# Patient Record
Sex: Male | Born: 1990
Health system: Southern US, Community
[De-identification: ages and names within clinical notes are randomized; demographics above are authoritative.]

## PROBLEM LIST (undated history)

## (undated) HISTORY — PX: WRIST SURGERY: SHX841

---
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
## 2016-09-21 ENCOUNTER — Encounter (HOSPITAL_BASED_OUTPATIENT_CLINIC_OR_DEPARTMENT_OTHER): Payer: Self-pay | Admitting: *Deleted

## 2016-09-21 ENCOUNTER — Emergency Department (HOSPITAL_BASED_OUTPATIENT_CLINIC_OR_DEPARTMENT_OTHER)
Admission: EM | Admit: 2016-09-21 | Discharge: 2016-09-21 | Disposition: A | Discharge status: Home / Self Care | Payer: 59 | Attending: Emergency Medicine | Admitting: Emergency Medicine

## 2016-09-21 DIAGNOSIS — Z23 Encounter for immunization: Secondary | ICD-10-CM | POA: Diagnosis not present

## 2016-09-21 DIAGNOSIS — W540XXA Bitten by dog, initial encounter: Secondary | ICD-10-CM | POA: Insufficient documentation

## 2016-09-21 DIAGNOSIS — Y929 Unspecified place or not applicable: Secondary | ICD-10-CM | POA: Diagnosis not present

## 2016-09-21 DIAGNOSIS — Y939 Activity, unspecified: Secondary | ICD-10-CM | POA: Insufficient documentation

## 2016-09-21 DIAGNOSIS — Y999 Unspecified external cause status: Secondary | ICD-10-CM | POA: Diagnosis not present

## 2016-09-21 DIAGNOSIS — S61432A Puncture wound without foreign body of left hand, initial encounter: Secondary | ICD-10-CM | POA: Insufficient documentation

## 2016-09-21 DIAGNOSIS — S6992XA Unspecified injury of left wrist, hand and finger(s), initial encounter: Secondary | ICD-10-CM | POA: Diagnosis present

## 2016-09-21 MED ORDER — TETANUS-DIPHTH-ACELL PERTUSSIS 5-2.5-18.5 LF-MCG/0.5 IM SUSP
0.5000 mL | Freq: Once | INTRAMUSCULAR | Status: AC
  Administered 2016-09-21: 0.5 mL via INTRAMUSCULAR
  Filled 2016-09-21: qty 0.5

## 2016-09-21 MED ORDER — AMOXICILLIN-POT CLAVULANATE 875-125 MG PO TABS: 1.0000 | ORAL_TABLET | Freq: Two times a day (BID) | ORAL | 0 refills | Status: DC

## 2016-09-21 MED FILL — AMOX-CLAV 875-125 MG TABLET: 875-125 | 7 days supply | Qty: 14 | Fill #0

## 2016-09-21 NOTE — ED Provider Notes (Signed)
[facility] Provider [facility]   CSN: [id number] Arrival date & time: 09/21/16  1420     History   Chief Complaint Chief Complaint  Patient presents with  . Animal Bite    HPI Brian Jacobson is a 26 y.o. male.  The history is provided by the patient and medical records. No language interpreter was used.  Animal Bite  Associated symptoms: no numbness     History reviewed. No pertinent past medical history.  There are no active problems to display for this patient.   History reviewed. No pertinent surgical history.   Brian Jacobson is an otherwise healthy 26 y.o. male who presents to the Emergency Department complaining of dog bite just prior to arrival. Small puncture wounds to left hand. No meds or treatments prior to arrival for symptoms. Unsure of the dog, but states dog was wearing a collar and looked like a pet. He does not believe he can locate the dog and has not contacted Research scientist (life sciences). No numbness, tingling. Able to move the hand without difficulty. Unsure of tetanus status.    Home Medications    Prior to Admission medications   Medication Sig Start Date End Date Taking? Authorizing Provider  amoxicillin-clavulanate (AUGMENTIN) 875-125 MG tablet Take 1 tablet by mouth every 12 (twelve) hours. 09/21/16   Lakayla Barrington, Chase Picket, PA-C    Family History History reviewed. No pertinent family history.  Social History Social History  Substance Use Topics  . Smoking status: Never Smoker  . Smokeless tobacco: Not on file  . Alcohol use No     Allergies   Patient has no known allergies.   Review of Systems Review of Systems  Skin: Positive for wound.  Neurological: Negative for weakness and numbness.     Physical Exam Updated Vital Signs BP (!) 143/96 (BP Location: Right Arm)   Pulse 65   Temp 98.4 F (36.9 C) (Oral)   Resp 18   Ht  (1.778 m)   Wt 70.3 kg (155 lb)   SpO2 99%   BMI 22.24 kg/m   Physical Exam  Constitutional: He appears  well-developed and well-nourished. No distress.  HENT:  Head: Normocephalic and atraumatic.  Neck: Neck supple.  Cardiovascular: Normal rate, regular rhythm and normal heart sounds.   No murmur heard. Pulmonary/Chest: Effort normal and breath sounds normal. No respiratory distress. He has no wheezes. He has no rales.  Musculoskeletal: Normal range of motion.  Left hand with full ROM and 5/5 strength. Sensation intact. 2+ radial pulse. Good cap refill of all digits.   Neurological: He is alert.  Skin: Skin is warm and dry.  Three very small puncture wound to left hand, all < 5mm.   Nursing note and vitals reviewed.    ED Treatments / Results  Labs (all labs ordered are listed, but only abnormal results are displayed) Labs Reviewed - No data to display  EKG  EKG Interpretation None       Radiology No results found.  Procedures Procedures (including critical care time)  Medications Ordered in ED Medications  Tdap (BOOSTRIX) injection 0.5 mL (0.5 mLs Intramuscular Given 09/21/16 1517)     Initial Impression / Assessment and Plan / ED Course  I have reviewed the triage vital signs and the nursing notes.  Pertinent labs & imaging results that were available during my care of the patient were reviewed by me and considered in my medical decision making (see chart for details).    Brian Jacobson is  a 26 y.o. male who presents to ED for evaluation of dog bite which occurred just prior to arrival. Very small puncture-like wounds which were thoroughly cleaned in ED. No wounds requiring repair. Tetanus updated. Discussed home wound care instructions. Patient was bitten by an unknown dog. He does not believe that he could find the animal and animal control was not called. I spoke with the patient at length about need for rabies vaccination however patient denies. Patient's nurse also spoke with him twice about receiving rabies vaccine. I specifically discussed that if patient did  contract rabies from the dog bite, this would be a life-threatening condition for which there is no cure. Patient still refused vaccine. Will give ABX for infection prevention. Reasons to return to ER discussed. All questions answered.  Final Clinical Impressions(s) / ED Diagnoses   Final diagnoses:  Dog bite, initial encounter    New Prescriptions New Prescriptions   AMOXICILLIN-CLAVULANATE (AUGMENTIN) 875-125 MG TABLET    Take 1 tablet by mouth every 12 (twelve) hours.     Rosali Augello, Chase Picket, PA-C 09/21/16 1542    Benjiman Core, MD 09/24/16 [phone]

## 2016-09-21 NOTE — Discharge Instructions (Signed)
It was my pleasure taking care of you today!   Please take all of your antibiotics until finished! This is to prevent infection.   Keep wounds clean and dry.   Return to ER for new or worsening symptoms, any additional concerns.

## 2016-09-21 NOTE — ED Notes (Signed)
ED Provider at bedside. 

## 2016-09-21 NOTE — ED Triage Notes (Signed)
Dog bite to left hand-middle finger and index finger. Unknown dog or Research scientist (life sciences). Bleeding controlled.

## 2017-02-15 ENCOUNTER — Other Ambulatory Visit: Payer: Self-pay

## 2017-02-15 ENCOUNTER — Encounter (HOSPITAL_BASED_OUTPATIENT_CLINIC_OR_DEPARTMENT_OTHER): Payer: Self-pay | Admitting: Emergency Medicine

## 2017-02-15 ENCOUNTER — Emergency Department (HOSPITAL_BASED_OUTPATIENT_CLINIC_OR_DEPARTMENT_OTHER)
Admission: EM | Admit: 2017-02-15 | Discharge: 2017-02-15 | Disposition: A | Discharge status: Home / Self Care | Payer: 59 | Attending: Emergency Medicine | Admitting: Emergency Medicine

## 2017-02-15 DIAGNOSIS — J111 Influenza due to unidentified influenza virus with other respiratory manifestations: Secondary | ICD-10-CM | POA: Insufficient documentation

## 2017-02-15 DIAGNOSIS — R69 Illness, unspecified: Secondary | ICD-10-CM | POA: Diagnosis present

## 2017-02-15 DIAGNOSIS — Z79899 Other long term (current) drug therapy: Secondary | ICD-10-CM | POA: Diagnosis not present

## 2017-02-15 MED ORDER — OSELTAMIVIR PHOSPHATE 75 MG PO CAPS: 75.0000 mg | ORAL_CAPSULE | Freq: Two times a day (BID) | ORAL | 0 refills | Status: DC

## 2017-02-15 NOTE — Discharge Instructions (Signed)
Tamiflu as prescribed.  Tylenol 1000 mg rotated with ibuprofen 600 mg every 4 hours as needed for pain or fever.  Drink plenty of fluids and get plenty of rest.  Return to the emergency department if you develop difficulty breathing, severe chest pain, severe weakness, or other new and concerning symptoms.

## 2017-02-15 NOTE — ED Provider Notes (Signed)
[facility] EMERGENCY DEPARTMENT Provider Note   CSN: [id number] Arrival date & time: 02/15/17  0133     History   Chief Complaint Chief Complaint  Patient presents with  . Influenza    HPI Brian Jacobson is a 27 y.o. male.  Patient is a 27 year old male with no significant past medical history.  He presents with a 2-day history of muscle aches, fever, cough, and headache.  This came on abruptly while at work.  He was seen at urgent care yesterday and was prescribed Zithromax and a muscle relaxer.  He returns tonight stating that he is not feeling any better.   The history is provided by the patient.  Influenza  Presenting symptoms: cough, fever, headache and myalgias   Severity:  Moderate Onset quality:  Sudden Duration:  2 days Progression:  Worsening Chronicity:  New Relieved by:  Nothing Worsened by:  Nothing   History reviewed. No pertinent past medical history.  There are no active problems to display for this patient.   History reviewed. No pertinent surgical history.     Home Medications    Prior to Admission medications   Medication Sig Start Date End Date Taking? Authorizing Provider  amoxicillin-clavulanate (AUGMENTIN) 875-125 MG tablet Take 1 tablet by mouth every 12 (twelve) hours. 09/21/16   Ward, Chase Picket, PA-C    Family History No family history on file.  Social History Social History   Tobacco Use  . Smoking status: Never Smoker  Substance Use Topics  . Alcohol use: No  . Drug use: No     Allergies   Patient has no known allergies.   Review of Systems Review of Systems  Constitutional: Positive for fever.  Respiratory: Positive for cough.   Musculoskeletal: Positive for myalgias.  Neurological: Positive for headaches.  All other systems reviewed and are negative.    Physical Exam Updated Vital Signs BP 107/72 (BP Location: Left Arm)   Pulse 100   Temp 100.2 F (37.9 C) (Oral)   Resp 20   SpO2 94%    Physical Exam  Constitutional: He is oriented to person, place, and time. He appears well-developed and well-nourished. No distress.  HENT:  Head: Normocephalic and atraumatic.  Mouth/Throat: Oropharynx is clear and moist.  Neck: Normal range of motion. Neck supple.  Cardiovascular: Normal rate and regular rhythm. Exam reveals no friction rub.  No murmur heard. Pulmonary/Chest: Effort normal and breath sounds normal. No respiratory distress. He has no wheezes. He has no rales.  Abdominal: Soft. Bowel sounds are normal. He exhibits no distension. There is no tenderness.  Musculoskeletal: Normal range of motion. He exhibits no edema.  Neurological: He is alert and oriented to person, place, and time. Coordination normal.  Skin: Skin is warm and dry. He is not diaphoretic.  Nursing note and vitals reviewed.    ED Treatments / Results  Labs (all labs ordered are listed, but only abnormal results are displayed) Labs Reviewed - No data to display  EKG  EKG Interpretation None       Radiology No results found.  Procedures Procedures (including critical care time)  Medications Ordered in ED Medications - No data to display   Initial Impression / Assessment and Plan / ED Course  I have reviewed the triage vital signs and the nursing notes.  Pertinent labs & imaging results that were available during my care of the patient were reviewed by me and considered in my medical decision making (see chart for details).  Vision symptoms are most likely viral, likely influenza.  He will be prescribed Tamiflu and advised to continue Tylenol/Motrin, plenty of fluids, and follow-up as needed.  Final Clinical Impressions(s) / ED Diagnoses   Final diagnoses:  None    ED Discharge Orders    None       Geoffery Lyonselo, Ashtian Villacis, MD 02/15/17 [phone]

## 2017-02-15 NOTE — ED Triage Notes (Signed)
Pt presents with c/o flu like symptoms since Sunday. Pt went to PCP today and was given a zpack but tonight started vomiting and states fever was 105.4. Pt states he took dayquil at that time

## 2017-05-23 ENCOUNTER — Other Ambulatory Visit: Payer: Self-pay

## 2017-05-23 ENCOUNTER — Encounter (HOSPITAL_BASED_OUTPATIENT_CLINIC_OR_DEPARTMENT_OTHER): Payer: Self-pay | Admitting: Emergency Medicine

## 2017-05-23 ENCOUNTER — Emergency Department (HOSPITAL_BASED_OUTPATIENT_CLINIC_OR_DEPARTMENT_OTHER): Payer: 59

## 2017-05-23 ENCOUNTER — Emergency Department (HOSPITAL_BASED_OUTPATIENT_CLINIC_OR_DEPARTMENT_OTHER)
Admission: EM | Admit: 2017-05-23 | Discharge: 2017-05-23 | Disposition: A | Discharge status: Home / Self Care | Payer: 59 | Attending: Physician Assistant | Admitting: Physician Assistant

## 2017-05-23 DIAGNOSIS — Y998 Other external cause status: Secondary | ICD-10-CM | POA: Diagnosis not present

## 2017-05-23 DIAGNOSIS — Y92310 Basketball court as the place of occurrence of the external cause: Secondary | ICD-10-CM | POA: Diagnosis not present

## 2017-05-23 DIAGNOSIS — W01198A Fall on same level from slipping, tripping and stumbling with subsequent striking against other object, initial encounter: Secondary | ICD-10-CM | POA: Diagnosis not present

## 2017-05-23 DIAGNOSIS — S62001A Unspecified fracture of navicular [scaphoid] bone of right wrist, initial encounter for closed fracture: Secondary | ICD-10-CM | POA: Diagnosis not present

## 2017-05-23 DIAGNOSIS — Y9367 Activity, basketball: Secondary | ICD-10-CM | POA: Insufficient documentation

## 2017-05-23 DIAGNOSIS — S6992XA Unspecified injury of left wrist, hand and finger(s), initial encounter: Secondary | ICD-10-CM | POA: Diagnosis present

## 2017-05-23 MED ORDER — OXYCODONE-ACETAMINOPHEN 5-325 MG PO TABS
1.0000 | ORAL_TABLET | Freq: Once | ORAL | Status: AC
  Administered 2017-05-23: 1 via ORAL
  Filled 2017-05-23: qty 1

## 2017-05-23 MED ORDER — HYDROCODONE-ACETAMINOPHEN 5-325 MG PO TABS: 1.0000 | ORAL_TABLET | ORAL | 0 refills | Status: AC | PRN

## 2017-05-23 MED FILL — HYDROCODON-APAP 5-325: 5-325 | 1 days supply | Qty: 10 | Fill #0

## 2017-05-23 NOTE — ED Triage Notes (Signed)
Pt playing basketball yesterday and injured left wrist.  Good pulses, able to move slightly but with pain, skin warm, no numbness.

## 2017-05-23 NOTE — ED Provider Notes (Signed)
[facility] EMERGENCY DEPARTMENT Provider Note   CSN: [id number] Arrival date & time: 05/23/17  7425     History   Chief Complaint Chief Complaint  Patient presents with  . Wrist Injury    HPI Brian Jacobson is a 27 y.o. male.  HPI 27 year old Afro-American male with no pertinent past medical history presents to the emergency department today for evaluation of left wrist pain.  Patient states he was playing basketball yesterday when he fell down striking his left wrist on the ground.  Patient reports immediate pain and has had constant pain since then.  Patient describes the pain as a throbbing sensation.  Patient reports pain is worse with range of motion and palpation over the dorsum of the hand.  He denies any associated paresthesias or weakness.  Taken Tylenol for pain with some improvement.  Patient denies head injury or LOC. No past medical history on file.  There are no active problems to display for this patient.   No past surgical history on file.      Home Medications    Prior to Admission medications   Medication Sig Start Date End Date Taking? Authorizing Provider  acetaminophen (TYLENOL) 500 MG tablet Take 500 mg by mouth once. 05/23/17  Yes [provider]    Family History No family history on file.  Social History Social History   Tobacco Use  . Smoking status: Never Smoker  . Smokeless tobacco: Never Used  Substance Use Topics  . Alcohol use: No  . Drug use: No     Allergies   Patient has no known allergies.   Review of Systems Review of Systems  Musculoskeletal: Positive for arthralgias, joint swelling and myalgias.  Skin: Negative for color change.  Neurological: Negative for weakness and numbness.     Physical Exam Updated Vital Signs BP (!) 142/96   Pulse 60   Temp 98.5 F (36.9 C)   Resp 16   Ht  (1.778 m)   Wt 70.3 kg (155 lb)   SpO2 99%   BMI 22.24 kg/m   Physical Exam  Constitutional: He  appears well-developed and well-nourished. No distress.  HENT:  Head: Normocephalic and atraumatic.  Eyes: Right eye exhibits no discharge. Left eye exhibits no discharge. No scleral icterus.  Neck: Normal range of motion.  Cardiovascular: Intact distal pulses.  Pulmonary/Chest: No respiratory distress.  Musculoskeletal: Normal range of motion.  Patient with pain and mild edema over the scaphoid region of the left wrist.  Also has some pain over the distal aspect of the radius and ulna.  Patient has full flexion of the wrist but extension of the wrist causes pain.  Patient is able to move all phalanges with some pain.  Brisk cap refill.  Radial pulses are 2+ bilaterally.  Sensation intact.  Grip strength is decreased secondary to pain.  Patient has poor opposition of the thumb secondary to pain.  There is no obvious deformity noted.  Neurological: He is alert.  Skin: Skin is warm and dry. Capillary refill takes less than 2 seconds. No pallor.  Psychiatric: His behavior is normal. Judgment and thought content normal.  Nursing note and vitals reviewed.    ED Treatments / Results  Labs (all labs ordered are listed, but only abnormal results are displayed) Labs Reviewed - No data to display  EKG None  Radiology Dg Wrist Complete Left  Result Date: 05/23/2017 CLINICAL DATA:  Basketball injury with wrist pain, initial encounter EXAM: LEFT WRIST -  COMPLETE 3+ VIEW COMPARISON:  None. FINDINGS: There is a transverse fracture through the midportion of the scaphoid bone without significant displacement of the distal fracture fragment. No other fractures are seen. IMPRESSION: Mid scaphoid fracture without significant displacement. Electronically Signed   By: Alcide Clever M.D.   On: 05/23/2017 10:02    Procedures Procedures (including critical care time) SPLINT APPLICATION Date/Time: 11:47 AM Authorized by: Demetrios Loll Consent: Verbal consent obtained. Risks and benefits: risks, benefits  and alternatives were discussed Consent given by: patient Splint applied by: orthopedic technician Location details: leftwrist Splint type: Thumb spica Supplies used: Prefabricated fiberglass material Post-procedure: The splinted body part was neurovascularly unchanged following the procedure. Patient tolerance: Patient tolerated the procedure well with no immediate complications.  ;int Medications Ordered in ED Medications  oxyCODONE-acetaminophen (PERCOCET/ROXICET) 5-325 MG per tablet 1 tablet (has no administration in time range)     Initial Impression / Assessment and Plan / ED Course  I have reviewed the triage vital signs and the nursing notes.  Pertinent labs & imaging results that were available during my care of the patient were reviewed by me and considered in my medical decision making (see chart for details).     She presents to the ED for evaluation of left wrist pain.  Patient neurovascularly intact.  X-ray shows mid scaphoid fracture without displacement.  I spoke with Dr. Janee Morn with hand surgery who recommends thumb spica splint and his office will call patient tomorrow to schedule an appointment.  Patient's pain controlled in the ED.  Splint was applied and he remains neurovascularly intact after splint application.  Will send home with pain medication and encouraged follow-up.  Pt is hemodynamically stable, in NAD, & able to ambulate in the ED. Evaluation does not show pathology that would require ongoing emergent intervention or inpatient treatment. I explained the diagnosis to the patient. Pain has been managed & has no complaints prior to dc. Pt is comfortable with above plan and is stable for discharge at this time. All questions were answered prior to disposition. Strict return precautions for f/u to the ED were discussed. Encouraged follow up with PCP.   Final Clinical Impressions(s) / ED Diagnoses   Final diagnoses:  Closed nondisplaced fracture of scaphoid  of left wrist, unspecified portion of scaphoid, initial encounter    ED Discharge Orders        Ordered    HYDROcodone-acetaminophen (NORCO/VICODIN) 5-325 MG tablet  Every 4 hours PRN     05/23/17 1145       Rise Mu, PA-C 05/23/17 1148    Mackuen, Cindee Salt, MD 05/24/17 [phone]

## 2017-05-23 NOTE — Discharge Instructions (Addendum)
noroc as needed for pain. Ice affected area (see instructions below).  Please call the orthopedic physician listed today or first thing in the morning to schedule a follow up appointment.   Fractures generally take 4-6 weeks to heal. It is very important to keep your splint dry until your follow up with the orthopedic doctor and a cast can be applied. You may place a plastic bag around the extremity with the splint while bathing to keep it dry. Also try to sleep with the extremity elevated for the next several nights to decrease swelling. Check the fingertips and toes several times per day to make sure they are not cold, pale, or blue. If this is the case, the splint may be too tight and should return to the ER, your regular doctor or the orthopedist for recheck. Return to the ER for new or worsening symptoms, any additional concerns.   COLD THERAPY DIRECTIONS:  Ice or gel packs can be used to reduce both pain and swelling. Ice is the most helpful within the first 24 to 48 hours after an injury or flareup from overusing a muscle or joint.  Ice is effective, has very few side effects, and is safe for most people to use.   If you expose your skin to cold temperatures for too long or without the proper protection, you can damage your skin or nerves. Watch for signs of skin damage due to cold.   HOME CARE INSTRUCTIONS  Follow these tips to use ice and cold packs safely.  Place a dry or damp towel between the ice and skin. A damp towel will cool the skin more quickly, so you may need to shorten the time that the ice is used.  For a more rapid response, add gentle compression to the ice.  Ice for no more than 10 to 20 minutes at a time. The bonier the area you are icing, the less time it will take to get the benefits of ice.  Check your skin after 5 minutes to make sure there are no signs of a poor response to cold or skin damage.  Rest 20 minutes or more in between uses.  Once your skin is numb, you can  end your treatment. You can test numbness by very lightly touching your skin. The touch should be so light that you do not see the skin dimple from the pressure of your fingertip. When using ice, most people will feel these normal sensations in this order: cold, burning, aching, and numbness.

## 2017-05-23 NOTE — ED Notes (Signed)
Applied sling to left arm.  Pt tolerated well.

## 2017-07-02 ENCOUNTER — Other Ambulatory Visit: Payer: Self-pay

## 2017-07-02 ENCOUNTER — Encounter (HOSPITAL_BASED_OUTPATIENT_CLINIC_OR_DEPARTMENT_OTHER): Payer: Self-pay | Admitting: Emergency Medicine

## 2017-07-02 ENCOUNTER — Emergency Department (HOSPITAL_BASED_OUTPATIENT_CLINIC_OR_DEPARTMENT_OTHER)
Admission: EM | Admit: 2017-07-02 | Discharge: 2017-07-02 | Disposition: A | Discharge status: Home / Self Care | Payer: 59 | Attending: Emergency Medicine | Admitting: Emergency Medicine

## 2017-07-02 ENCOUNTER — Emergency Department (HOSPITAL_BASED_OUTPATIENT_CLINIC_OR_DEPARTMENT_OTHER): Payer: 59

## 2017-07-02 DIAGNOSIS — R0789 Other chest pain: Secondary | ICD-10-CM | POA: Insufficient documentation

## 2017-07-02 LAB — BASIC METABOLIC PANEL
Anion gap: 7 (ref 5–15)
BUN: 13 mg/dL (ref 6–20)
CHLORIDE: 106 mmol/L (ref 98–111)
CO2: 27 mmol/L (ref 22–32)
Calcium: 8.8 mg/dL — ABNORMAL LOW (ref 8.9–10.3)
Creatinine, Ser: 1.1 mg/dL (ref 0.61–1.24)
Glucose, Bld: 91 mg/dL (ref 70–99)
POTASSIUM: 3.6 mmol/L (ref 3.5–5.1)
SODIUM: 140 mmol/L (ref 135–145)

## 2017-07-02 LAB — CBC
HEMATOCRIT: 39 % (ref 39.0–52.0)
Hemoglobin: 13 g/dL (ref 13.0–17.0)
MCH: 28.3 pg (ref 26.0–34.0)
MCHC: 33.3 g/dL (ref 30.0–36.0)
MCV: 85 fL (ref 78.0–100.0)
Platelets: 192 10*3/uL (ref 150–400)
RBC: 4.59 MIL/uL (ref 4.22–5.81)
RDW: 12.6 % (ref 11.5–15.5)
WBC: 6.7 10*3/uL (ref 4.0–10.5)

## 2017-07-02 LAB — TROPONIN I
TROPONIN I: 0.03 ng/mL — AB (ref <0.03)
Troponin I: 0.03 ng/mL (ref <0.03)

## 2017-07-02 NOTE — ED Triage Notes (Signed)
L side chest pain since yesterday. Pt was taking a shower when it started. Denies SOB, N/V

## 2017-07-02 NOTE — ED Notes (Signed)
Pt reports he had left wrist fracture surgery in May. He is wearing a splint.

## 2017-07-02 NOTE — ED Notes (Signed)
Troponin 0.03, given to ED MD

## 2017-07-02 NOTE — ED Provider Notes (Signed)
[facility] EMERGENCY DEPARTMENT Provider Note   CSN: [id number] Arrival date & time: 07/02/17  1801     History   Chief Complaint Chief Complaint  Patient presents with  . Chest Pain    HPI Brian Jacobson is a 27 y.o. male.  27yo M who p/w chest pain. Pain is constant and dull at rest, becomes more severe with certain movements and activity. He notes some sharp pain when he takes a deep breath. No shortness of breath, N/V, diaphoresis, cough/cold, or fever. No recent travel, h/o cancer, h/o clots, or FH of blood clots.  FH notable for maternal grandfather with history of heart disease.  The history is provided by the patient.    History reviewed. No pertinent past medical history.  There are no active problems to display for this patient.   Past Surgical History:  Procedure Laterality Date  . WRIST SURGERY          Home Medications    Prior to Admission medications   Medication Sig Start Date End Date Taking? Authorizing Provider  acetaminophen (TYLENOL) 500 MG tablet Take 500 mg by mouth once. 05/23/17   [provider]  HYDROcodone-acetaminophen (NORCO/VICODIN) 5-325 MG tablet Take 1-2 tablets by mouth every 4 (four) hours as needed. 05/23/17   Rise MuLeaphart, Kenneth T, PA-C    Family History No family history on file.  Social History Social History   Tobacco Use  . Smoking status: Never Smoker  . Smokeless tobacco: Never Used  Substance Use Topics  . Alcohol use: No  . Drug use: No     Allergies   Patient has no known allergies.   Review of Systems Review of Systems All other systems reviewed and are negative except that which was mentioned in HPI   Physical Exam Updated Vital Signs BP 105/71 (BP Location: Left Arm)   Pulse (!) 55   Temp 98.4 F (36.9 C) (Oral)   Resp (!) 23   Ht 5\' 11"  (1.803 m)   Wt 70.3 kg (155 lb)   SpO2 100%   BMI 21.62 kg/m   Physical Exam  Constitutional: He is oriented to person, place, and  time. He appears well-developed and well-nourished. No distress.  HENT:  Head: Normocephalic and atraumatic.  Moist mucous membranes  Eyes: Pupils are equal, round, and reactive to light. Conjunctivae are normal.  Neck: Neck supple.  Cardiovascular: Normal rate, regular rhythm and normal heart sounds.  No murmur heard. Pulmonary/Chest: Effort normal and breath sounds normal.  Abdominal: Soft. Bowel sounds are normal. He exhibits no distension. There is no tenderness.  Musculoskeletal: He exhibits no edema.  No focal chest wall tenderness  Neurological: He is alert and oriented to person, place, and time.  Fluent speech  Skin: Skin is warm and dry.  Psychiatric: He has a normal mood and affect. Judgment normal.  Nursing note and vitals reviewed.    ED Treatments / Results  Labs (all labs ordered are listed, but only abnormal results are displayed) Labs Reviewed  BASIC METABOLIC PANEL - Abnormal; Notable for the following components:      Result Value   Calcium 8.8 (*)    All other components within normal limits  TROPONIN I - Abnormal; Notable for the following components:   Troponin I 0.03 (*)    All other components within normal limits  TROPONIN I - Abnormal; Notable for the following components:   Troponin I 0.03 (*)    All other components within normal limits  CBC    EKG EKG Interpretation  Date/Time:  Saturday July 02 2017 18:07:57 EDT Ventricular Rate:  81 PR Interval:  152 QRS Duration: 88 QT Interval:  366 QTC Calculation: 425 R Axis:   86 Text Interpretation:  Normal sinus rhythm Possible Left atrial enlargement ST elevation, consider early repolarization, pericarditis, or injury Abnormal ECG No previous ECGs available Confirmed by Frederick Peers [phone]) on 07/02/2017 8:07:28 PM   Radiology Dg Chest 2 View  Result Date: 07/02/2017 CLINICAL DATA:  Acute onset left-sided chest pain for 2 days, initial encounter EXAM: CHEST - 2 VIEW COMPARISON:  None.  FINDINGS: The heart size and mediastinal contours are within normal limits. Both lungs are clear. The visualized skeletal structures are unremarkable. IMPRESSION: No active cardiopulmonary disease. Electronically Signed   By: Alcide Clever M.D.   On: 07/02/2017 18:51    Procedures Procedures (including critical care time)  Medications Ordered in ED Medications - No data to display   Initial Impression / Assessment and Plan / ED Course  I have reviewed the triage vital signs and the nursing notes.  Pertinent labs & imaging results that were available during my care of the patient were reviewed by me and considered in my medical decision making (see chart for details).    He was well-appearing on exam with normal vital signs.  EKG without acute ischemic changes.  Chest x-ray normal.  His pain was not reproducible on palpation but he could reproduce it with certain twisting movements and laying on one side.  His description seemed very musculoskeletal in nature.  He is PERC negative and with normal VS, I feel PE is very unlikely.  He had initial troponin of 0.03.  He wanted to leave to get home for his wife and child, therefore obtained serial troponin at 2-hour rather than 3-hour mark which he understands is less than ideal.  Repeat remained stable.  He has no risk factors for early heart disease and denies drug use therefore I feel he is safe for discharge.  Have discussed supportive measures including scheduled NSAID course and reviewed return precautions.  Final Clinical Impressions(s) / ED Diagnoses   Final diagnoses:  Atypical chest pain    ED Discharge Orders    None       Aundrea Higginbotham, Ambrose Finland, MD 07/02/17 2221

## 2018-12-05 ENCOUNTER — Encounter (HOSPITAL_BASED_OUTPATIENT_CLINIC_OR_DEPARTMENT_OTHER): Payer: Self-pay | Admitting: Emergency Medicine

## 2018-12-05 ENCOUNTER — Emergency Department (HOSPITAL_BASED_OUTPATIENT_CLINIC_OR_DEPARTMENT_OTHER): Payer: Self-pay

## 2018-12-05 ENCOUNTER — Other Ambulatory Visit: Payer: Self-pay

## 2018-12-05 ENCOUNTER — Emergency Department (HOSPITAL_BASED_OUTPATIENT_CLINIC_OR_DEPARTMENT_OTHER)
Admission: EM | Admit: 2018-12-05 | Discharge: 2018-12-05 | Disposition: A | Discharge status: Home / Self Care | Payer: Self-pay | Attending: Emergency Medicine | Admitting: Emergency Medicine

## 2018-12-05 DIAGNOSIS — S39012A Strain of muscle, fascia and tendon of lower back, initial encounter: Secondary | ICD-10-CM | POA: Insufficient documentation

## 2018-12-05 DIAGNOSIS — Y998 Other external cause status: Secondary | ICD-10-CM | POA: Insufficient documentation

## 2018-12-05 DIAGNOSIS — Y9389 Activity, other specified: Secondary | ICD-10-CM | POA: Insufficient documentation

## 2018-12-05 DIAGNOSIS — Y9289 Other specified places as the place of occurrence of the external cause: Secondary | ICD-10-CM | POA: Insufficient documentation

## 2018-12-05 DIAGNOSIS — X500XXA Overexertion from strenuous movement or load, initial encounter: Secondary | ICD-10-CM | POA: Insufficient documentation

## 2018-12-05 MED ORDER — KETOROLAC TROMETHAMINE 30 MG/ML IJ SOLN
60.0000 mg | Freq: Once | INTRAMUSCULAR | Status: AC
  Administered 2018-12-05: 22:00:00 60 mg via INTRAMUSCULAR
  Filled 2018-12-05: qty 2

## 2018-12-05 MED ORDER — METHOCARBAMOL 500 MG PO TABS: 500.0000 mg | ORAL_TABLET | Freq: Two times a day (BID) | ORAL | 0 refills | Status: AC

## 2018-12-05 NOTE — ED Triage Notes (Signed)
Pt is c/o lower back pain that started on Sunday  Denies injury  Pt states pain radiates down both legs at times

## 2018-12-05 NOTE — Discharge Instructions (Signed)
Your examination today is most concerning for a muscular injury 1. Medications: alternate ibuprofen and tylenol for pain control, take all usual home medications as they are prescribed 2. Treatment: rest, ice, elevate and use an ACE wrap or other compressive therapy to decrease swelling. Also drink plenty of fluids and do plenty of gentle stretching and move the affected muscle through its normal range of motion to prevent stiffness. 3. Follow Up: If your symptoms do not improve please follow up with orthopedics/sports medicine or your PCP for discussion of your diagnoses and further evaluation after today's visit; if you do not have a primary care doctor use the resource guide provided to find one; Please return to the ER for worsening symptoms or other concerns.    Please use Tylenol or ibuprofen for pain.  You may use 600 mg ibuprofen every 6 hours or 1000 mg of Tylenol every 6 hours.  You may choose to alternate between the 2.  This would be most effective.  Not to exceed 4 g of Tylenol within 24 hours.  Not to exceed 3200 mg ibuprofen 24 hours.  Prescribed a muscle relaxer please use as directed.
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
## 2021-08-07 IMAGING — CR DG LUMBAR SPINE 2-3V
3 series · 3 of 3 positions shown · non-contrast
Comparison: None.

CLINICAL DATA: Back pain

EXAM:
LUMBAR SPINE - 2-3 VIEW

[t l-spine a.p.]
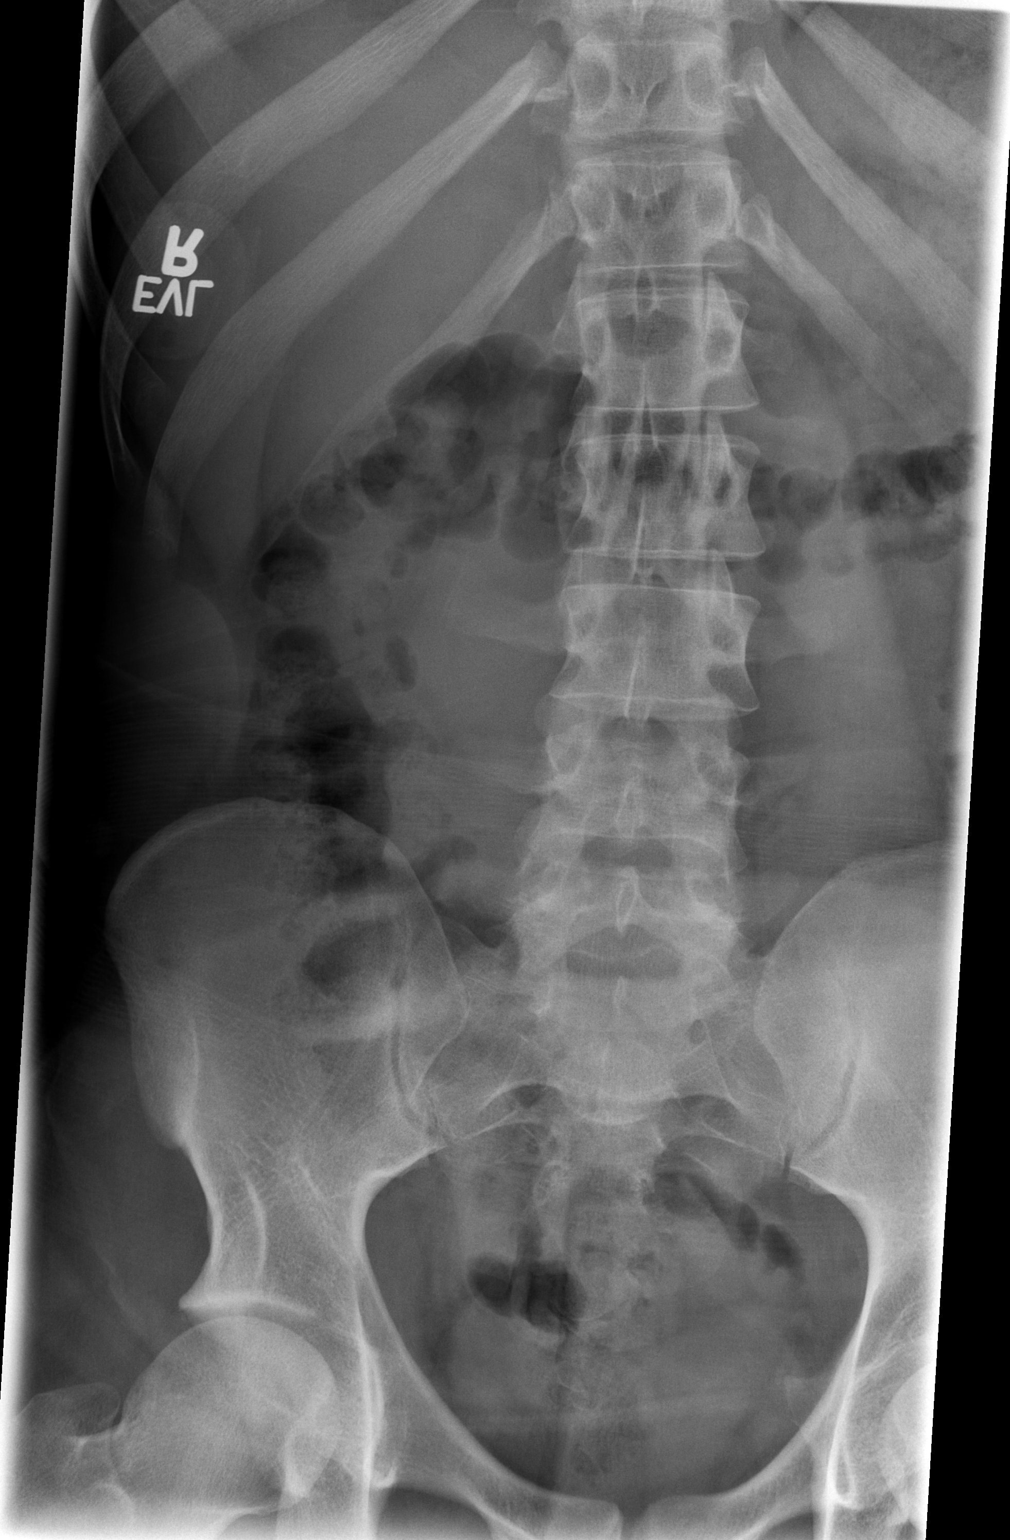

[t l-spine lat]
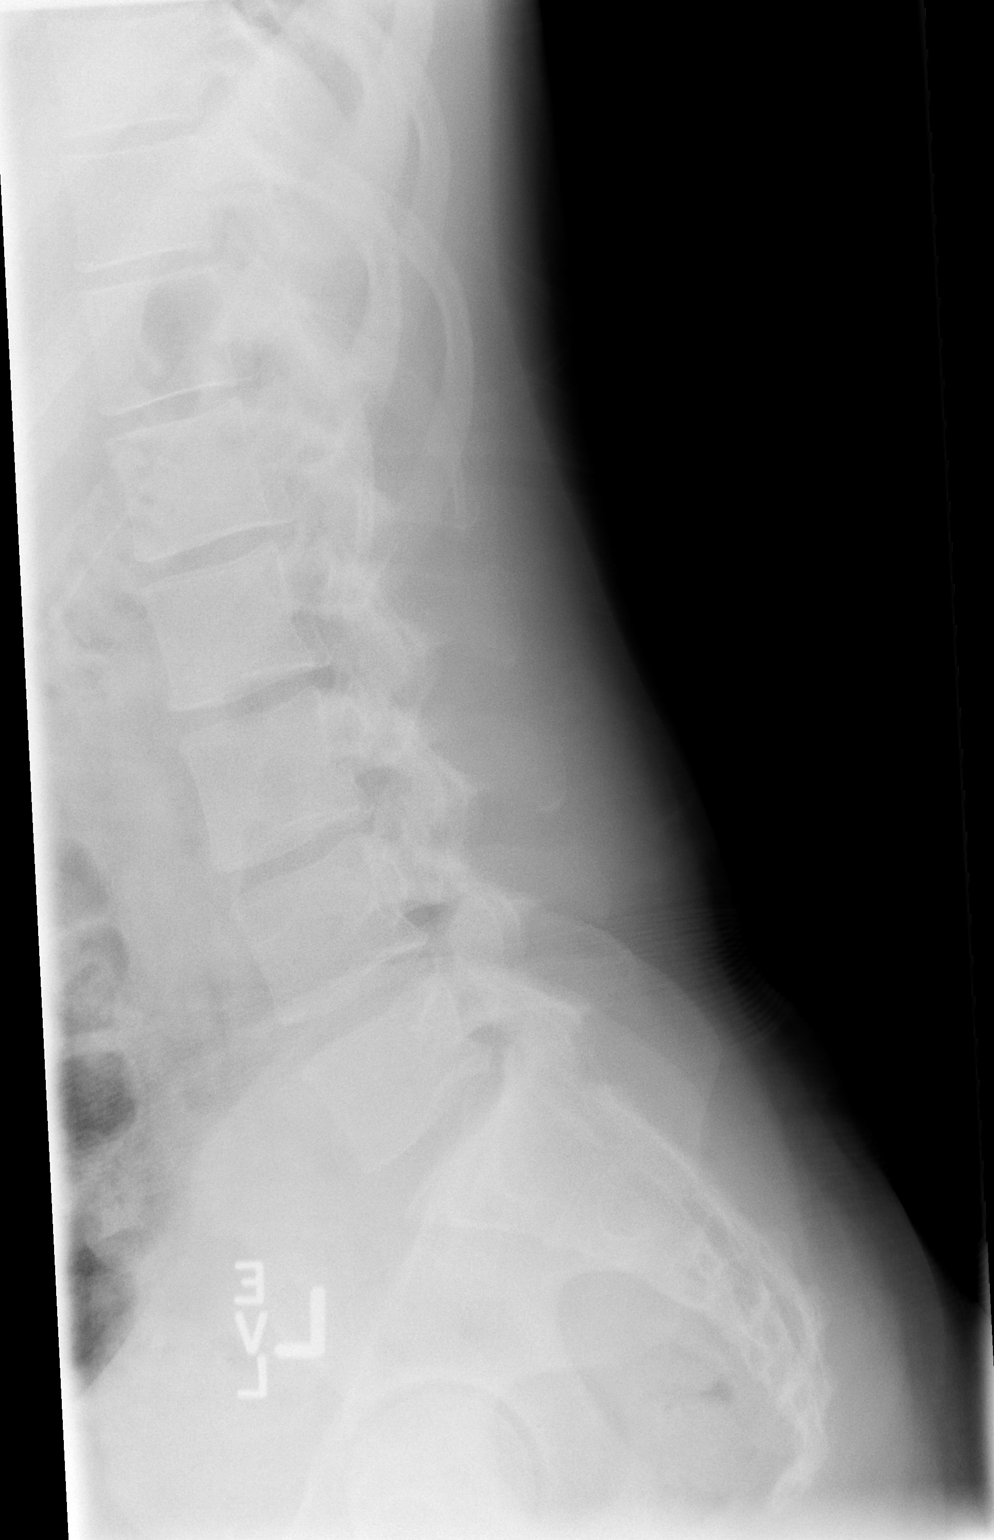

[t l-spine l5-s1 spot]
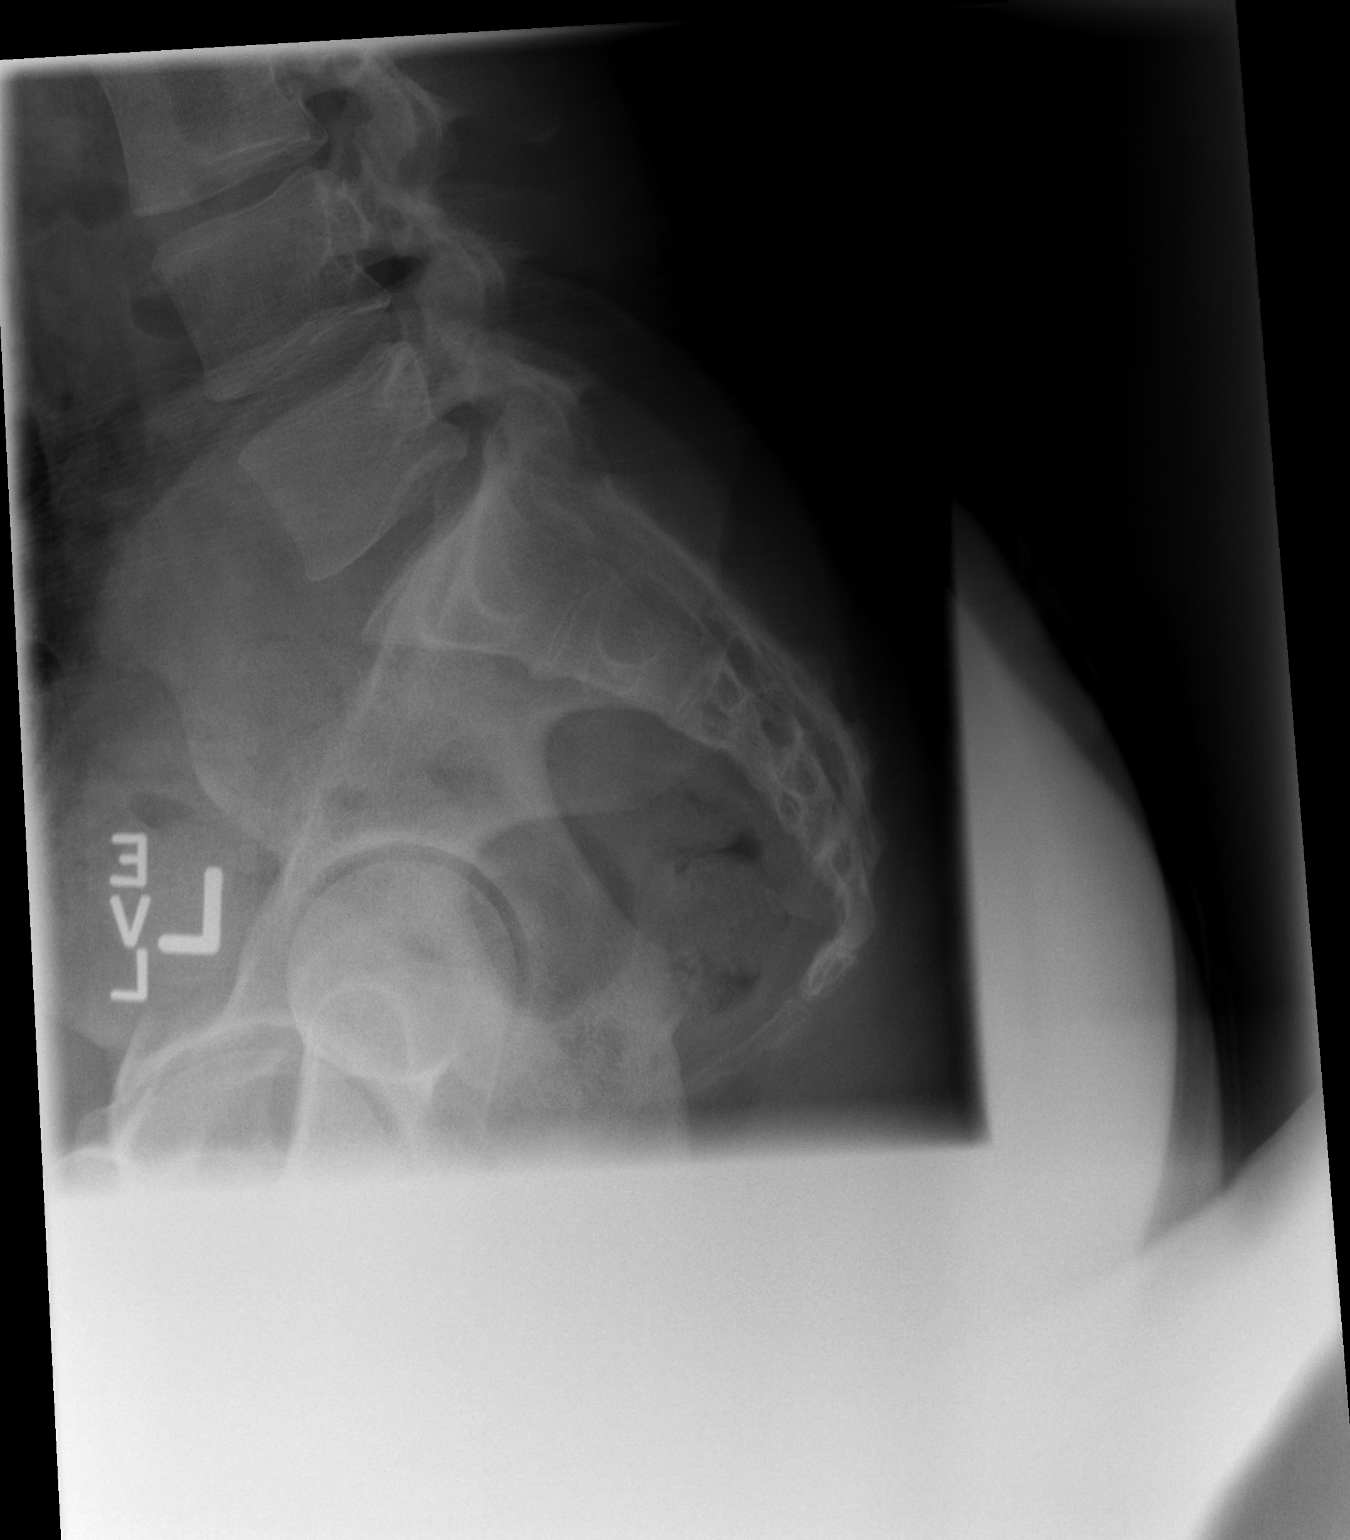

[3 of 3 positions shown; findings below may reference images not displayed]

FINDINGS: There is no evidence of lumbar spine fracture. Alignment is normal.
Intervertebral disc spaces are maintained.
IMPRESSION: Negative.
